# Patient Record
Sex: Male | Born: 1990 | Race: White | Hispanic: No | Marital: Married | Smoking: Former smoker
Health system: Southern US, Community
[De-identification: ages and names within clinical notes are randomized; demographics above are authoritative.]

## PROBLEM LIST (undated history)

## (undated) DIAGNOSIS — S62309A Unspecified fracture of unspecified metacarpal bone, initial encounter for closed fracture: Secondary | ICD-10-CM

## (undated) DIAGNOSIS — J45990 Exercise induced bronchospasm: Secondary | ICD-10-CM

## (undated) DIAGNOSIS — S60511A Abrasion of right hand, initial encounter: Secondary | ICD-10-CM

## (undated) DIAGNOSIS — T7840XA Allergy, unspecified, initial encounter: Secondary | ICD-10-CM

## (undated) HISTORY — DX: Allergy, unspecified, initial encounter: T78.40XA

---
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
## 2008-02-16 ENCOUNTER — Emergency Department (HOSPITAL_COMMUNITY): Admission: EM | Admit: 2008-02-16 | Discharge: 2008-02-16 | Payer: Self-pay | Admitting: Emergency Medicine

## 2010-05-21 ENCOUNTER — Ambulatory Visit: Payer: Self-pay | Admitting: Diagnostic Radiology

## 2010-05-21 ENCOUNTER — Emergency Department (HOSPITAL_BASED_OUTPATIENT_CLINIC_OR_DEPARTMENT_OTHER): Admission: EM | Admit: 2010-05-21 | Discharge: 2010-05-21 | Payer: Self-pay | Admitting: Emergency Medicine

## 2010-11-04 ENCOUNTER — Emergency Department (HOSPITAL_BASED_OUTPATIENT_CLINIC_OR_DEPARTMENT_OTHER)
Admission: EM | Admit: 2010-11-04 | Discharge: 2010-11-04 | Payer: Self-pay | Source: Home / Self Care | Admitting: Emergency Medicine

## 2011-08-16 ENCOUNTER — Emergency Department (INDEPENDENT_AMBULATORY_CARE_PROVIDER_SITE_OTHER): Payer: Managed Care, Other (non HMO)

## 2011-08-16 ENCOUNTER — Encounter: Payer: Self-pay | Admitting: *Deleted

## 2011-08-16 ENCOUNTER — Emergency Department (HOSPITAL_BASED_OUTPATIENT_CLINIC_OR_DEPARTMENT_OTHER)
Admission: EM | Admit: 2011-08-16 | Discharge: 2011-08-16 | Disposition: A | Discharge status: Home / Self Care | Payer: Managed Care, Other (non HMO) | Attending: Emergency Medicine | Admitting: Emergency Medicine

## 2011-08-16 DIAGNOSIS — M25529 Pain in unspecified elbow: Secondary | ICD-10-CM

## 2011-08-16 DIAGNOSIS — S51809A Unspecified open wound of unspecified forearm, initial encounter: Secondary | ICD-10-CM | POA: Insufficient documentation

## 2011-08-16 DIAGNOSIS — Y92410 Unspecified street and highway as the place of occurrence of the external cause: Secondary | ICD-10-CM | POA: Insufficient documentation

## 2011-08-16 DIAGNOSIS — Z189 Retained foreign body fragments, unspecified material: Secondary | ICD-10-CM

## 2011-08-16 DIAGNOSIS — S51812A Laceration without foreign body of left forearm, initial encounter: Secondary | ICD-10-CM

## 2011-08-16 MED ORDER — CEPHALEXIN 500 MG PO CAPS: 500.0000 mg | ORAL_CAPSULE | Freq: Four times a day (QID) | ORAL | Status: AC

## 2011-08-16 MED ORDER — LIDOCAINE HCL 2 % IJ SOLN
INTRAMUSCULAR | Status: AC
  Administered 2011-08-16: 20 mg
  Filled 2011-08-16: qty 1

## 2011-08-16 MED ORDER — HYDROCODONE-ACETAMINOPHEN 5-325 MG PO TABS: 2.0000 | ORAL_TABLET | ORAL | Status: AC | PRN
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
## 2011-08-17 NOTE — ED Provider Notes (Signed)
Medical screening examination/treatment/procedure(s) were performed by non-physician practitioner and as supervising physician I was immediately available for consultation/collaboration.   Giavonni Cizek, MD 08/17/11 0048 

## 2014-01-26 ENCOUNTER — Encounter (HOSPITAL_BASED_OUTPATIENT_CLINIC_OR_DEPARTMENT_OTHER): Payer: Self-pay | Admitting: *Deleted

## 2014-01-26 NOTE — Progress Notes (Signed)
NPO AFTER MN WITH EXCEPTION CLEAR LIQUIDS UNTIL 0800 (NO CREAM/ MILK PRODUCTS).  ARRIVE AT 1245. NEEDS HG.  MAY TAKE HYDROCODONE OR TYLENOL AM DOS W/ SIPS  OF WATER.

## 2014-01-27 ENCOUNTER — Encounter (HOSPITAL_BASED_OUTPATIENT_CLINIC_OR_DEPARTMENT_OTHER): Payer: BC Managed Care – PPO | Admitting: Anesthesiology

## 2014-01-27 ENCOUNTER — Encounter (HOSPITAL_BASED_OUTPATIENT_CLINIC_OR_DEPARTMENT_OTHER): Payer: Self-pay

## 2014-01-27 ENCOUNTER — Encounter (HOSPITAL_BASED_OUTPATIENT_CLINIC_OR_DEPARTMENT_OTHER)
Admission: RE | Disposition: A | Discharge status: Home / Self Care | Payer: Self-pay | Source: Ambulatory Visit | Attending: Orthopedic Surgery

## 2014-01-27 ENCOUNTER — Ambulatory Visit (HOSPITAL_BASED_OUTPATIENT_CLINIC_OR_DEPARTMENT_OTHER): Payer: BC Managed Care – PPO | Admitting: Anesthesiology

## 2014-01-27 ENCOUNTER — Ambulatory Visit (HOSPITAL_BASED_OUTPATIENT_CLINIC_OR_DEPARTMENT_OTHER)
Admission: RE | Admit: 2014-01-27 | Discharge: 2014-01-27 | Disposition: A | Discharge status: Home / Self Care | Payer: BC Managed Care – PPO | Source: Ambulatory Visit | Attending: Orthopedic Surgery | Admitting: Orthopedic Surgery

## 2014-01-27 DIAGNOSIS — F17200 Nicotine dependence, unspecified, uncomplicated: Secondary | ICD-10-CM | POA: Insufficient documentation

## 2014-01-27 DIAGNOSIS — Z79899 Other long term (current) drug therapy: Secondary | ICD-10-CM | POA: Insufficient documentation

## 2014-01-27 DIAGNOSIS — J45990 Exercise induced bronchospasm: Secondary | ICD-10-CM | POA: Insufficient documentation

## 2014-01-27 DIAGNOSIS — X58XXXA Exposure to other specified factors, initial encounter: Secondary | ICD-10-CM | POA: Insufficient documentation

## 2014-01-27 DIAGNOSIS — S62319A Displaced fracture of base of unspecified metacarpal bone, initial encounter for closed fracture: Secondary | ICD-10-CM

## 2014-01-27 HISTORY — DX: Exercise induced bronchospasm: J45.990

## 2014-01-27 HISTORY — DX: Abrasion of right hand, initial encounter: S60.511A

## 2014-01-27 HISTORY — PX: FINGER CLOSED REDUCTION: SHX1633

## 2014-01-27 HISTORY — DX: Unspecified fracture of unspecified metacarpal bone, initial encounter for closed fracture: S62.309A

## 2014-01-27 LAB — POCT HEMOGLOBIN-HEMACUE: HEMOGLOBIN: 15.2 g/dL (ref 13.0–17.0)

## 2014-01-27 SURGERY — CLOSED REDUCTION, FRACTURE, METACARPAL BONE
Anesthesia: General | Site: Finger | Laterality: Right

## 2014-01-27 MED ORDER — PROMETHAZINE HCL 25 MG/ML IJ SOLN
6.2500 mg | INTRAMUSCULAR | Status: DC | PRN
  Filled 2014-01-27: qty 1

## 2014-01-27 MED ORDER — HYDROMORPHONE HCL PF 1 MG/ML IJ SOLN
INTRAMUSCULAR | Status: AC
  Filled 2014-01-27: qty 1

## 2014-01-27 MED ORDER — FENTANYL CITRATE 0.05 MG/ML IJ SOLN
INTRAMUSCULAR | Status: AC
  Filled 2014-01-27: qty 6

## 2014-01-27 MED ORDER — HYDROMORPHONE HCL PF 1 MG/ML IJ SOLN
0.2500 mg | INTRAMUSCULAR | Status: DC | PRN
  Administered 2014-01-27: 0.5 mg via INTRAVENOUS
  Filled 2014-01-27: qty 1

## 2014-01-27 MED ORDER — OXYCODONE HCL 5 MG PO TABS
5.0000 mg | ORAL_TABLET | Freq: Once | ORAL | Status: AC | PRN
  Administered 2014-01-27: 5 mg via ORAL
  Filled 2014-01-27: qty 1

## 2014-01-27 MED ORDER — LIDOCAINE HCL (CARDIAC) 20 MG/ML IV SOLN
INTRAVENOUS | Status: DC | PRN
Start: 2014-01-27 — End: 2014-01-27
  Administered 2014-01-27: 60 mg via INTRAVENOUS

## 2014-01-27 MED ORDER — MIDAZOLAM HCL 2 MG/2ML IJ SOLN
INTRAMUSCULAR | Status: AC
  Filled 2014-01-27: qty 2

## 2014-01-27 MED ORDER — CEFAZOLIN SODIUM-DEXTROSE 2-3 GM-% IV SOLR
2.0000 g | INTRAVENOUS | Status: AC
  Administered 2014-01-27: 2 g via INTRAVENOUS
  Filled 2014-01-27: qty 50

## 2014-01-27 MED ORDER — MIDAZOLAM HCL 5 MG/5ML IJ SOLN
INTRAMUSCULAR | Status: DC | PRN
  Administered 2014-01-27: 2 mg via INTRAVENOUS

## 2014-01-27 MED ORDER — ONDANSETRON HCL 4 MG/2ML IJ SOLN
INTRAMUSCULAR | Status: AC
  Filled 2014-01-27: qty 2

## 2014-01-27 MED ORDER — OXYCODONE HCL 5 MG PO TABS
ORAL_TABLET | ORAL | Status: AC
  Filled 2014-01-27: qty 1

## 2014-01-27 MED ORDER — OXYCODONE HCL 5 MG/5ML PO SOLN
5.0000 mg | Freq: Once | ORAL | Status: AC | PRN
  Filled 2014-01-27: qty 5

## 2014-01-27 MED ORDER — MEPERIDINE HCL 25 MG/ML IJ SOLN
6.2500 mg | INTRAMUSCULAR | Status: DC | PRN
  Filled 2014-01-27: qty 1

## 2014-01-27 MED ORDER — HYDROCODONE-ACETAMINOPHEN 5-325 MG PO TABS: 1.0000 | ORAL_TABLET | Freq: Four times a day (QID) | ORAL | Status: DC | PRN

## 2014-01-27 MED ORDER — CHLORHEXIDINE GLUCONATE 4 % EX LIQD
60.0000 mL | Freq: Once | CUTANEOUS | Status: DC
  Filled 2014-01-27: qty 60

## 2014-01-27 MED ORDER — BUPIVACAINE HCL (PF) 0.5 % IJ SOLN
INTRAMUSCULAR | Status: DC | PRN
Start: 2014-01-27 — End: 2014-01-27
  Administered 2014-01-27: 4 mL

## 2014-01-27 MED ORDER — LACTATED RINGERS IV SOLN
INTRAVENOUS | Status: DC
  Administered 2014-01-27 (×2): via INTRAVENOUS
  Filled 2014-01-27: qty 1000

## 2014-01-27 MED ORDER — LIDOCAINE HCL 1 % IJ SOLN
INTRAMUSCULAR | Status: DC | PRN
  Administered 2014-01-27: 4 mL

## 2014-01-27 MED ORDER — PROPOFOL 10 MG/ML IV BOLUS
INTRAVENOUS | Status: DC | PRN
  Administered 2014-01-27: 200 mg via INTRAVENOUS

## 2014-01-27 MED ORDER — FENTANYL CITRATE 0.05 MG/ML IJ SOLN
INTRAMUSCULAR | Status: DC | PRN
  Administered 2014-01-27 (×2): 25 ug via INTRAVENOUS
  Administered 2014-01-27: 50 ug via INTRAVENOUS

## 2014-01-27 MED ORDER — DEXAMETHASONE SODIUM PHOSPHATE 4 MG/ML IJ SOLN
INTRAMUSCULAR | Status: DC | PRN
  Administered 2014-01-27: 10 mg via INTRAVENOUS

## 2014-01-27 MED ORDER — ONDANSETRON HCL 4 MG/2ML IJ SOLN
INTRAMUSCULAR | Status: DC | PRN
  Administered 2014-01-27: 4 mg via INTRAVENOUS

## 2014-01-27 MED ORDER — DOCUSATE SODIUM 100 MG PO CAPS: 100.0000 mg | ORAL_CAPSULE | Freq: Two times a day (BID) | ORAL | Status: DC

## 2014-01-27 SURGICAL SUPPLY — 55 items
BANDAGE ELASTIC 3 VELCRO ST LF (GAUZE/BANDAGES/DRESSINGS) ×6 IMPLANT
BANDAGE GAUZE ELAST BULKY 4 IN (GAUZE/BANDAGES/DRESSINGS) ×3 IMPLANT
BENZOIN TINCTURE PRP APPL 2/3 (GAUZE/BANDAGES/DRESSINGS) IMPLANT
BLADE SURG 15 STRL LF DISP TIS (BLADE) ×1 IMPLANT
BLADE SURG 15 STRL SS (BLADE) ×2
BNDG COHESIVE 3X5 TAN STRL LF (GAUZE/BANDAGES/DRESSINGS) ×6 IMPLANT
BNDG GAUZE ELAST 4 BULKY (GAUZE/BANDAGES/DRESSINGS) ×3 IMPLANT
CLOSURE WOUND 1/2 X4 (GAUZE/BANDAGES/DRESSINGS)
CLOTH BEACON ORANGE TIMEOUT ST (SAFETY) ×3 IMPLANT
CORDS BIPOLAR (ELECTRODE) IMPLANT
COVER MAYO STAND STRL (DRAPES) ×3 IMPLANT
COVER TABLE BACK 60X90 (DRAPES) ×3 IMPLANT
CUFF TOURNIQUET SINGLE 18IN (TOURNIQUET CUFF) ×3 IMPLANT
CUFF TOURNIQUET SINGLE 24IN (TOURNIQUET CUFF) IMPLANT
DRAIN PENROSE 18X1/4 LTX STRL (WOUND CARE) IMPLANT
DRAPE EXTREMITY T 121X128X90 (DRAPE) ×3 IMPLANT
DRAPE OEC MINIVIEW 54X84 (DRAPES) ×3 IMPLANT
DRAPE SURG 17X23 STRL (DRAPES) ×3 IMPLANT
DRSG EMULSION OIL 3X3 NADH (GAUZE/BANDAGES/DRESSINGS) IMPLANT
ELECT NEEDLE TIP 2.8 STRL (NEEDLE) IMPLANT
ELECT REM PT RETURN 9FT ADLT (ELECTROSURGICAL)
ELECTRODE REM PT RTRN 9FT ADLT (ELECTROSURGICAL) IMPLANT
GAUZE SPONGE 4X4 16PLY XRAY LF (GAUZE/BANDAGES/DRESSINGS) IMPLANT
GAUZE XEROFORM 1X8 LF (GAUZE/BANDAGES/DRESSINGS) ×3 IMPLANT
GLOVE BIO SURGEON STRL SZ8 (GLOVE) ×3 IMPLANT
GLOVE BIOGEL PI IND STRL 7.5 (GLOVE) ×2 IMPLANT
GLOVE BIOGEL PI INDICATOR 7.5 (GLOVE) ×4
GLOVE SURG SS PI 7.5 STRL IVOR (GLOVE) ×3 IMPLANT
GOWN STRL REUS W/TWL LRG LVL3 (GOWN DISPOSABLE) IMPLANT
GOWN STRL REUS W/TWL XL LVL3 (GOWN DISPOSABLE) ×6 IMPLANT
K-WIRE .035X4 (WIRE) IMPLANT
K-WIRE .045X4 (WIRE) ×15 IMPLANT
LOOP VESSEL MAXI BLUE (MISCELLANEOUS) IMPLANT
NEEDLE HYPO 25X1 1.5 SAFETY (NEEDLE) IMPLANT
NS IRRIG 500ML POUR BTL (IV SOLUTION) ×3 IMPLANT
PACK BASIN DAY SURGERY FS (CUSTOM PROCEDURE TRAY) ×3 IMPLANT
PAD CAST 3X4 CTTN HI CHSV (CAST SUPPLIES) ×1 IMPLANT
PADDING CAST ABS 4INX4YD NS (CAST SUPPLIES) ×2
PADDING CAST ABS COTTON 4X4 ST (CAST SUPPLIES) ×1 IMPLANT
PADDING CAST COTTON 3X4 STRL (CAST SUPPLIES) ×2
PENCIL BUTTON HOLSTER BLD 10FT (ELECTRODE) IMPLANT
SPLINT PLASTER CAST XFAST 3X15 (CAST SUPPLIES) ×1 IMPLANT
SPLINT PLASTER XTRA FASTSET 3X (CAST SUPPLIES) ×2
SPONGE GAUZE 4X4 12PLY (GAUZE/BANDAGES/DRESSINGS) ×3 IMPLANT
STOCKINETTE 4X48 STRL (DRAPES) ×3 IMPLANT
STRIP CLOSURE SKIN 1/2X4 (GAUZE/BANDAGES/DRESSINGS) IMPLANT
SUT ETHILON 4 0 P 3 18 (SUTURE) IMPLANT
SUT ETHILON 5 0 P 3 18 (SUTURE)
SUT NYLON ETHILON 5-0 P-3 1X18 (SUTURE) IMPLANT
SYR BULB 3OZ (MISCELLANEOUS) ×3 IMPLANT
SYR CONTROL 10ML LL (SYRINGE) ×3 IMPLANT
TOWEL OR 17X24 6PK STRL BLUE (TOWEL DISPOSABLE) ×6 IMPLANT
UNDERPAD 30X30 INCONTINENT (UNDERPADS AND DIAPERS) ×3 IMPLANT
WATER STERILE IRR 500ML POUR (IV SOLUTION) ×3 IMPLANT
k-wires ×6 IMPLANT

## 2014-01-27 NOTE — Discharge Instructions (Signed)
KEEP BANDAGE CLEAN AND DRY CALL OFFICE FOR F/U APPT [phone] in 10 days [facility] PH[facility] [phone] KEEP HAND ELEVATED ABOVE HEART OK TO APPLY ICE TO OPERATIVE AREA CONTACT OFFICE IF ANY WORSENING PAIN OR CONCERNS.        HAND SURGERY    HOME CARE INSTRUCTIONS    The following instructions have been prepared to help you care for yourself upon your return home today.  Wound Care:  Keep your hand elevated above the level of your heart. Do not allow it to dangle by your side. Keep the dressing dry and do not remove it unless your doctor advises you to do so. He will usually change it at the time of you post-op visit. Moving your fingers is advised to stimulate circulation but will depend on the site of your surgery. Of course, if you have a splint applied your doctor will advise you about movement.  Activity:  Do not drive or operate machinery today. Rest today and then you may return to your normal activity and work as indicated by your physician.  Diet: Drink liquids today or eat a light diet. You may resume a regular diet tomorrow.  General expectations: Pain for two or three days. Fingers may become slightly swollen.   Unexpected Observations- Call your doctor if any of these occur: Severe pain not relieved by pain medication. Elevated temperature. Dressing soaked with blood. Inability to move fingers. White or bluish color to fingers.      Post Anesthesia Home Care Instructions  Activity: Get plenty of rest for the remainder of the day. A responsible adult should stay with you for 24 hours following the procedure.  For the next 24 hours, DO NOT: -Drive a car -Advertising copywriterperate machinery -Drink alcoholic beverages -Take any medication unless instructed by your physician -Make any legal decisions or sign important papers.  Meals: Start with liquid foods such as gelatin or soup. Progress to regular foods as tolerated. Avoid greasy, spicy, heavy foods. If nausea and/or  vomiting occur, drink only clear liquids until the nausea and/or vomiting subsides. Call your physician if vomiting continues.  Special Instructions/Symptoms: Your throat may feel dry or sore from the anesthesia or the breathing tube placed in your throat during surgery. If this causes discomfort, gargle with warm salt water. The discomfort should disappear within 24 hours.

## 2014-01-27 NOTE — H&P (Signed)
Chad Strickland is an 23 y.o. male.   Chief Complaint: right hand injury HPI: pt seen/evaluated in office  Pt with displaced right small finger metacarpal fracture Pt here for surgery for displaced fracture No prior surgery to right hand  Past Medical History  Diagnosis Date  . Asthma, exercise induced   . Metacarpal bone fracture     right small finger  . Abrasion of hand, right     History reviewed. No pertinent past surgical history.  History reviewed. No pertinent family history. Social History:  reports that he has been smoking Cigarettes.  He has a 1.75 pack-year smoking history. He has never used smokeless tobacco. He reports that he drinks alcohol. He reports that he does not use illicit drugs.  Allergies: No Known Allergies  Medications Prior to Admission  Medication Sig Dispense Refill  . cetirizine (ZYRTEC) 10 MG tablet Take 10 mg by mouth daily.      Marland Kitchen. acetaminophen (TYLENOL) 500 MG tablet Take 500 mg by mouth every 6 (six) hours as needed.      . ALBUTEROL IN Inhale into the lungs as needed.      Marland Kitchen. HYDROcodone-acetaminophen (NORCO/VICODIN) 5-325 MG per tablet Take 1 tablet by mouth every 6 (six) hours as needed for moderate pain.      Marland Kitchen. ibuprofen (ADVIL,MOTRIN) 200 MG tablet Take 200 mg by mouth every 6 (six) hours as needed.        No results found for this or any previous visit (from the past 48 hour(s)). No results found.  ROS NO RECENT ILLNESSES OR HOSPITALIZATIONS  Blood pressure 138/73, pulse 66, temperature 96.8 F (36 C), temperature source Oral, resp. rate 14, height 5\' 8"  (1.727 m), weight 56.473 kg (124 lb 8 oz), SpO2 100.00%. Physical Exam  General Appearance:  Alert, cooperative, no distress, appears stated age  Head:  Normocephalic, without obvious abnormality, atraumatic  Eyes:  Pupils equal, conjunctiva/corneas clear,         Throat: Lips, mucosa, and tongue normal; teeth and gums normal  Neck: No visible masses     Lungs:   respirations  unlabored  Chest Wall:  No tenderness or deformity  Heart:  Regular rate and rhythm,  Abdomen:   Soft, non-tender,         Extremities: RIGHT HAND: SPLINT IN PLACE FINGERS WARM WELL PERFUSED GOOD CAP REFIL  Pulses: 2+ and symmetric  Skin: Skin color, texture, turgor normal, no rashes or lesions     Neurologic: Normal    Assessment/Plan RIGHT SMALL FINGER METACARPAL BASE FRACTURE, DISPLACED  RIGHT SMALL FINGER CLOSED REDUCTION AND PINNING POSSIBLE OPEN REDUCTION AND INTERNAL FIXATION  R/B/A DISCUSSED WITH PT IN OFFICE.  PT VOICED UNDERSTANDING OF PLAN CONSENT SIGNED DAY OF SURGERY PT SEEN AND EXAMINED PRIOR TO OPERATIVE PROCEDURE/DAY OF SURGERY SITE MARKED. QUESTIONS ANSWERED WILL [facility] SURGERY  Thomasene RippleFred W [facility] 01/27/2014, 1:04 PM

## 2014-01-27 NOTE — Progress Notes (Signed)
Complained of nausea with IV start, become unresponsive. Head lowered the slowly came around and starting responding to verbal commands. Dr. Renold DonGermeroth into see.

## 2014-01-27 NOTE — Anesthesia Procedure Notes (Signed)
Procedure Name: LMA Insertion Date/Time: 01/27/2014 2:12 PM Performed by: Renella CunasHAZEL, Rameses Ou D Pre-anesthesia Checklist: Patient identified, Emergency Drugs available, Suction available and Patient being monitored Patient Re-evaluated:Patient Re-evaluated prior to inductionOxygen Delivery Method: Circle System Utilized Preoxygenation: Pre-oxygenation with 100% oxygen Intubation Type: IV induction Ventilation: Mask ventilation without difficulty LMA: LMA inserted LMA Size: 4.0 Number of attempts: 1 Airway Equipment and Method: bite block Placement Confirmation: positive ETCO2 Tube secured with: Tape Dental Injury: Teeth and Oropharynx as per pre-operative assessment

## 2014-01-27 NOTE — Brief Op Note (Signed)
01/27/2014  1:06 PM  PATIENT:  Chad Strickland  23 y.o. male  PRE-OPERATIVE DIAGNOSIS:  RIGHT SMALL FINGER METACARPAL FRACTURE  POST-OPERATIVE DIAGNOSIS:  * No post-op diagnosis entered *  PROCEDURE:  Procedure(s): CLOSED REDUCTION AND PINNING AND POSSIBLE ORIF (Right)  SURGEON:  Surgeon(s) and Role:    * Sharma CovertFred W Maxamilian Amadon, MD - Primary  PHYSICIAN ASSISTANT:   ASSISTANTS: none   ANESTHESIA:   general  EBL:     BLOOD ADMINISTERED:none  DRAINS: none   LOCAL MEDICATIONS USED:  MARCAINE     SPECIMEN:  No Specimen  DISPOSITION OF SPECIMEN:  N/A  COUNTS:  YES  TOURNIQUET:  * No tourniquets in log *  DICTATIO: [id number]: [id number]  PLAN OF CARE: Discharge to home after PACU  PATIENT DISPOSITION:  PACU - hemodynamically stable.   Delay start of Pharmacological VTE agent (>24hrs) due to surgical blood loss or risk of bleeding: not applicable

## 2014-01-27 NOTE — Anesthesia Postprocedure Evaluation (Signed)
  Anesthesia Post-op Note  Patient: Chad Strickland  Procedure(s) Performed: Procedure(s) (LRB): CLOSED REDUCTION  of small finger metacarpal fracture PINNING with x 3 k-wires (Right)  Patient Location: PACU  Anesthesia Type: General  Level of Consciousness: awake and alert   Airway and Oxygen Therapy: Patient Spontanous Breathing  Post-op Pain: mild  Post-op Assessment: Post-op Vital signs reviewed, Patient's Cardiovascular Status Stable, Respiratory Function Stable, Patent Airway and No signs of Nausea or vomiting  Last Vitals:  Filed Vitals:   01/27/14 1634  BP: 118/81  Pulse: 72  Temp: 36.3 C  Resp: 16    Post-op Vital Signs: stable   Complications: No apparent anesthesia complications

## 2014-01-27 NOTE — Transfer of Care (Signed)
Immediate Anesthesia Transfer of Care Note  Patient: Chad Strickland  Procedure(s) Performed: Procedure(s) (LRB): CLOSED REDUCTION AND PINNING with x 3 k-wires (Right)  Patient Location: PACU  Anesthesia Type: General  Level of Consciousness: awake, oriented, sedated and patient cooperative  Airway & Oxygen Therapy: Patient Spontanous Breathing and Patient connected to face mask oxygen  Post-op Assessment: Report given to PACU RN and Post -op Vital signs reviewed and stable  Post vital signs: Reviewed and stable  Complications: No apparent anesthesia complications

## 2014-01-27 NOTE — Anesthesia Preprocedure Evaluation (Addendum)
Anesthesia Evaluation  Patient identified by MRN, date of birth, ID band Patient awake    Reviewed: Allergy & Precautions, H&P , NPO status , Patient's Chart, lab work & pertinent test results  Airway Mallampati: I TM Distance: >3 FB Neck ROM: Full    Dental   Pulmonary asthma , Current Smoker,  breath sounds clear to auscultation        Cardiovascular negative cardio ROS  Rhythm:Regular Rate:Normal     Neuro/Psych negative neurological ROS  negative psych ROS   GI/Hepatic negative GI ROS, Neg liver ROS,   Endo/Other  negative endocrine ROS  Renal/GU negative Renal ROS     Musculoskeletal negative musculoskeletal ROS (+)   Abdominal   Peds  Hematology negative hematology ROS (+)   Anesthesia Other Findings   Reproductive/Obstetrics                         Anesthesia Physical Anesthesia Plan  ASA: II  Anesthesia Plan: General   Post-op Pain Management:    Induction: Intravenous  Airway Management Planned: LMA  Additional Equipment:   Intra-op Plan:   Post-operative Plan: Extubation in OR  Informed Consent: I have reviewed the patients History and Physical, chart, labs and discussed the procedure including the risks, benefits and alternatives for the proposed anesthesia with the patient or authorized representative who has indicated his/her understanding and acceptance.   Dental advisory given  Plan Discussed with: CRNA  Anesthesia Plan Comments:        Anesthesia Quick Evaluation

## 2014-01-28 ENCOUNTER — Encounter (HOSPITAL_BASED_OUTPATIENT_CLINIC_OR_DEPARTMENT_OTHER): Payer: Self-pay | Admitting: Orthopedic Surgery
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
## 2015-02-16 ENCOUNTER — Other Ambulatory Visit: Payer: Self-pay | Admitting: Family Medicine

## 2015-02-16 ENCOUNTER — Ambulatory Visit
Admission: RE | Admit: 2015-02-16 | Discharge: 2015-02-16 | Disposition: A | Discharge status: Home / Self Care | Payer: BLUE CROSS/BLUE SHIELD | Source: Ambulatory Visit | Attending: Family Medicine | Admitting: Family Medicine

## 2015-02-16 DIAGNOSIS — R42 Dizziness and giddiness: Secondary | ICD-10-CM

## 2015-02-16 DIAGNOSIS — R112 Nausea with vomiting, unspecified: Secondary | ICD-10-CM

## 2015-02-16 DIAGNOSIS — R519 Headache, unspecified: Secondary | ICD-10-CM

## 2015-02-16 DIAGNOSIS — R51 Headache: Secondary | ICD-10-CM

## 2016-02-11 IMAGING — CT CT HEAD W/O CM
1 series · 16 of 30 positions shown, 20 images · non-contrast
Comparison: None.

CLINICAL DATA: Headaches, dizziness, nausea, and vomiting. Symptoms
began yesterday.

EXAM:
CT HEAD WITHOUT CONTRAST
TECHNIQUE: Contiguous axial images were obtained from the base of the skull
through the vertex without intravenous contrast.

[Series 3: head wo 5.0 h31s · axial · 0.47mm/px · z∈[+222,+357]mm · 16 of 31 slices shown, 20 images]
[im 2/31  brain]
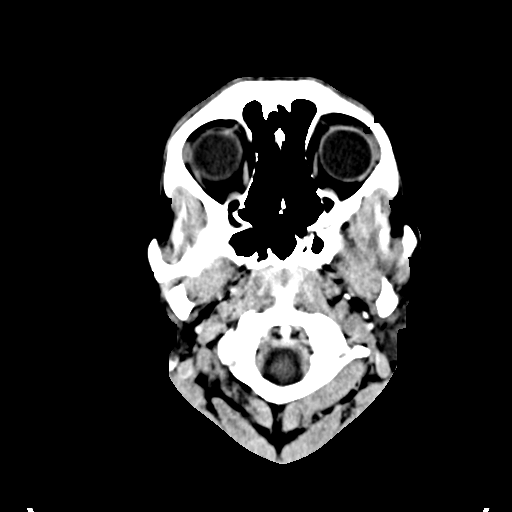
[im 2/31  bone]
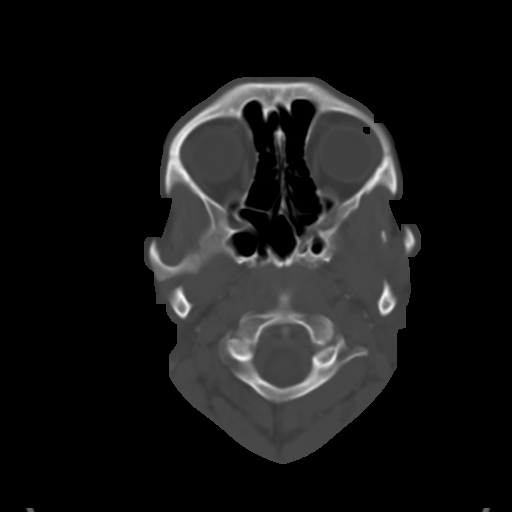
[im 4/31  brain]
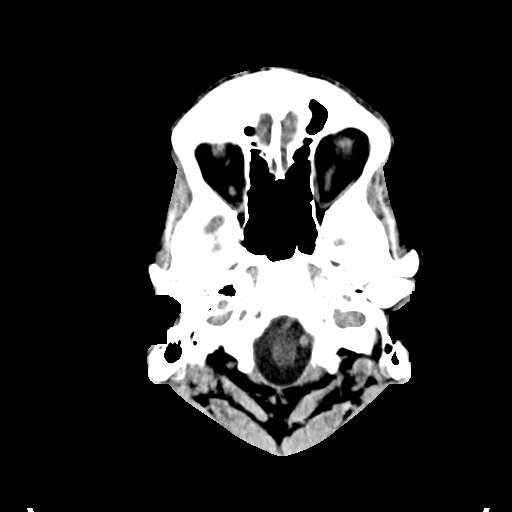
[im 6/31  brain]
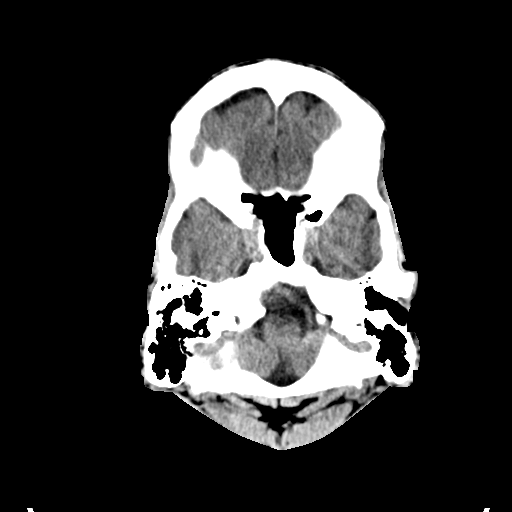
[im 8/31  brain]
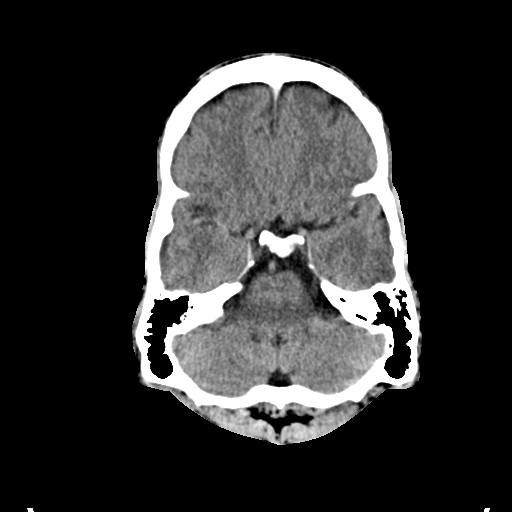
[im 9/31  brain]
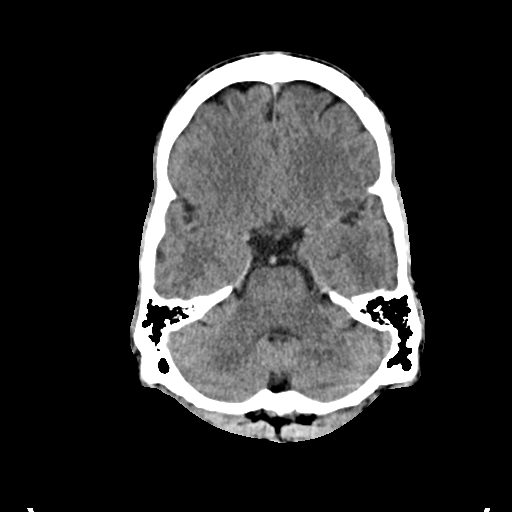
[im 9/31  bone]
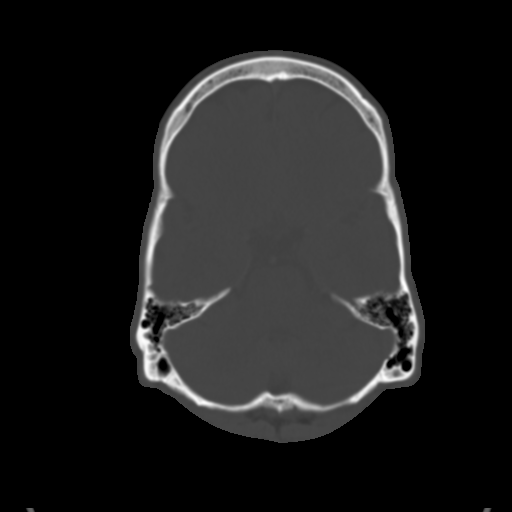
[im 11/31  brain]
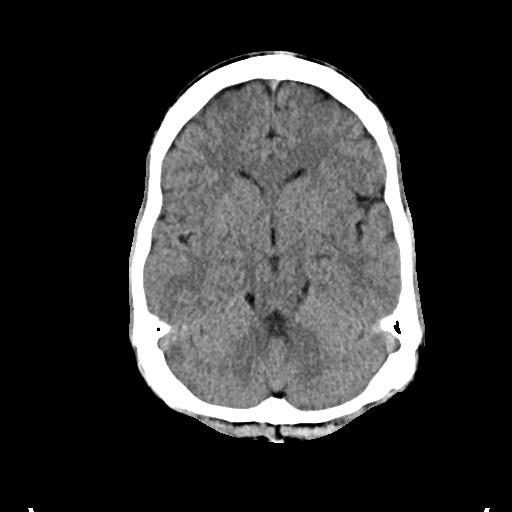
[im 13/31  brain]
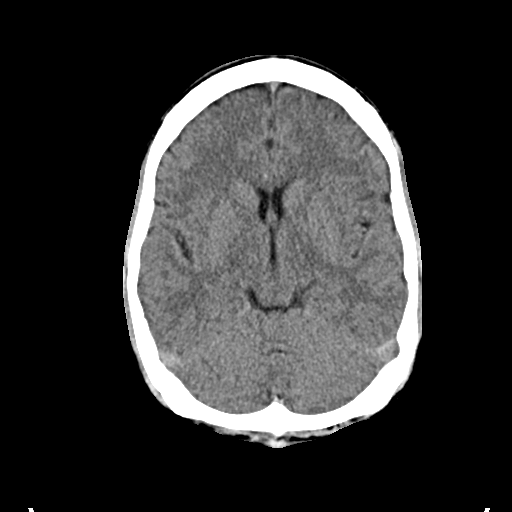
[im 15/31  brain]
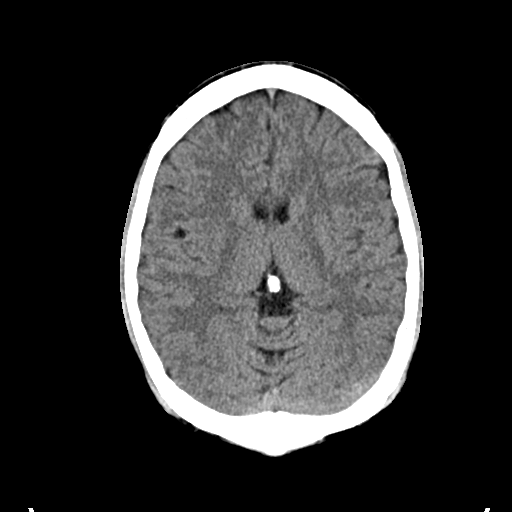
[im 16/31  brain]
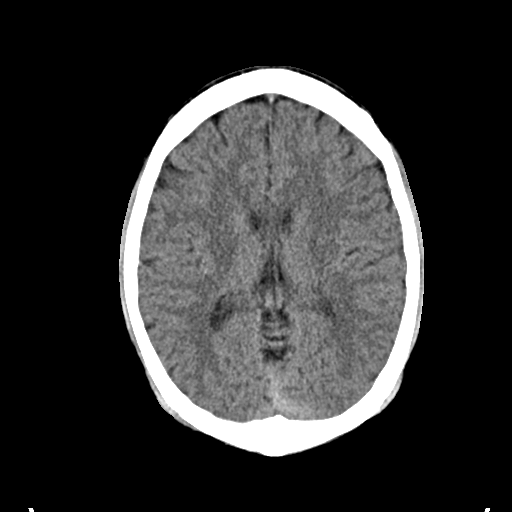
[im 16/31  bone]
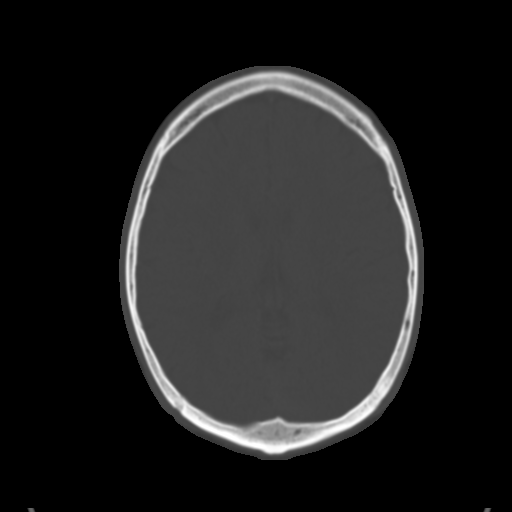
[im 18/31  brain]
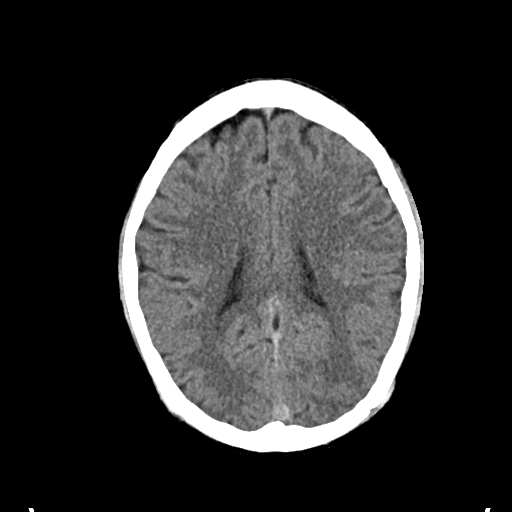
[im 20/31  brain]
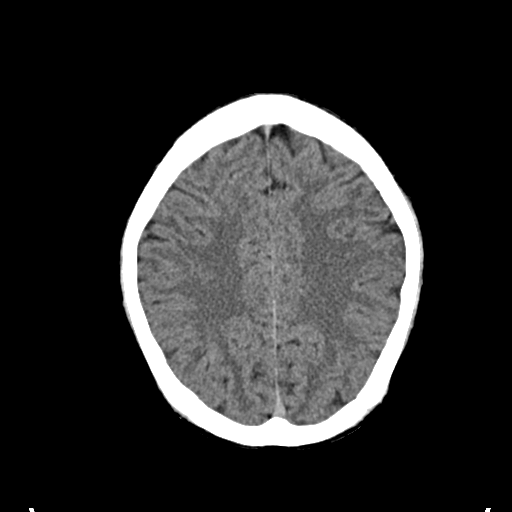
[im 22/31  brain]
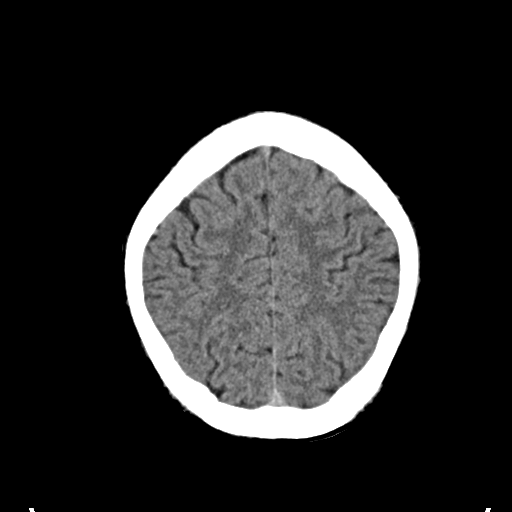
[im 23/31  brain]
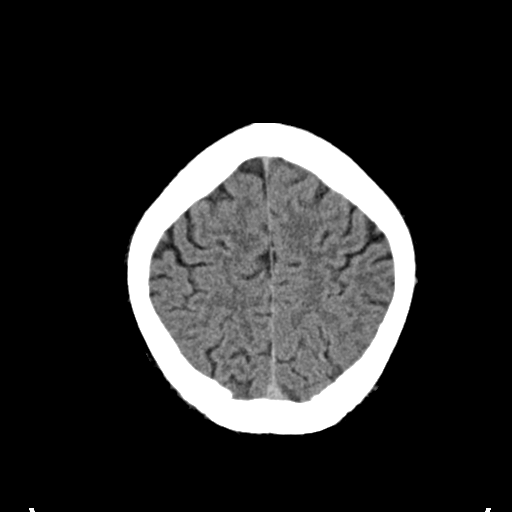
[im 23/31  bone]
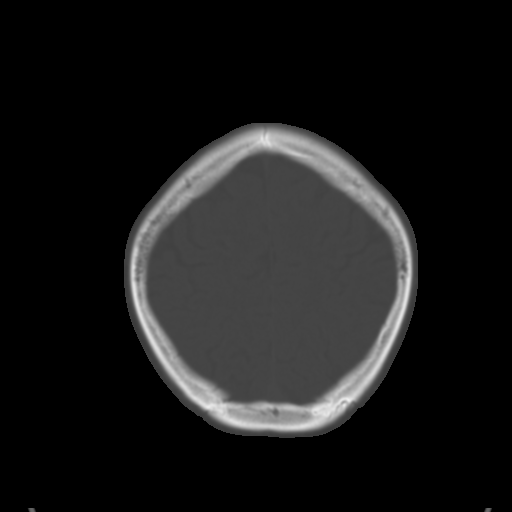
[im 25/31  brain]
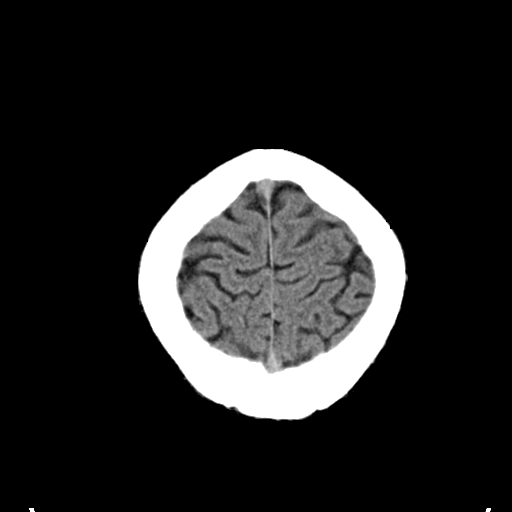
[im 27/31  brain]
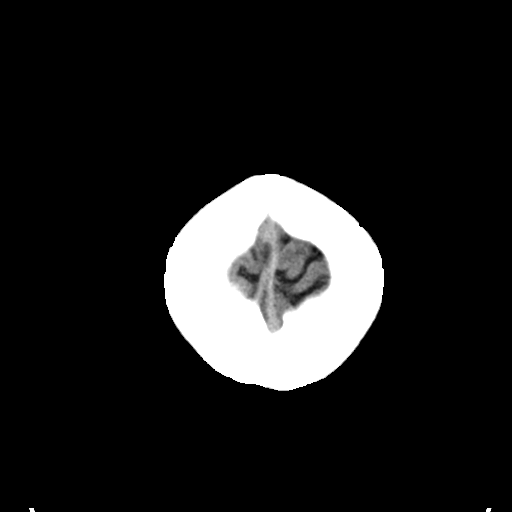
[im 29/31  brain]
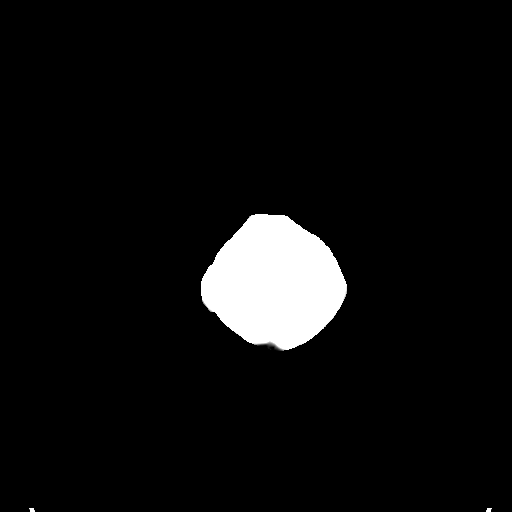

[16 of 30 positions shown; findings below may reference images not displayed]

FINDINGS: The ventricles and sulci are within normal limits for age. There is
no evidence of acute infarct, intracranial hemorrhage, mass, midline
shift, or extra-axial collection.

The visualized orbits are unremarkable. The visualized paranasal
sinuses and mastoid air cells are clear. No skull fracture is
identified.
IMPRESSION: Unremarkable head CT.

These results will be called to the ordering clinician or
representative by the [HOSPITAL] at the imaging location.

## 2018-11-25 ENCOUNTER — Encounter: Payer: Self-pay | Admitting: Medical

## 2018-11-25 ENCOUNTER — Ambulatory Visit (INDEPENDENT_AMBULATORY_CARE_PROVIDER_SITE_OTHER): Payer: Managed Care, Other (non HMO) | Admitting: Medical

## 2018-11-25 VITALS — BP 125/80 | HR 58 | Temp 98.3°F | Resp 16 | Ht 68.0 in | Wt 164.3 lb

## 2018-11-25 DIAGNOSIS — G43809 Other migraine, not intractable, without status migrainosus: Secondary | ICD-10-CM

## 2018-11-25 DIAGNOSIS — J302 Other seasonal allergic rhinitis: Secondary | ICD-10-CM | POA: Diagnosis not present

## 2018-11-25 DIAGNOSIS — J4520 Mild intermittent asthma, uncomplicated: Secondary | ICD-10-CM | POA: Diagnosis not present

## 2018-11-25 MED ORDER — ALBUTEROL SULFATE HFA 108 (90 BASE) MCG/ACT IN AERS: 2.0000 | INHALATION_SPRAY | Freq: Four times a day (QID) | RESPIRATORY_TRACT | 2 refills | Status: DC | PRN

## 2018-11-25 MED ORDER — ALBUTEROL SULFATE HFA 108 (90 BASE) MCG/ACT IN AERS: 2.0000 | INHALATION_SPRAY | Freq: Four times a day (QID) | RESPIRATORY_TRACT | 2 refills | Status: AC | PRN

## 2018-11-25 NOTE — Progress Notes (Signed)
Subjective:    Patient ID: Chad Strickland, male    DOB: 04/04/1991, 28 y.o.   MRN: [id number]  HPI Pt in for first time here.  He states no prior pcp. 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 He went to [employer]. Pt does not exercise regularly. But does play paint ball a lot on weekend when it is not cold. Pt admits he does not eat healthy. He does vape daily. 1-2 beers a week at most.  Pt is married. No children.  He has history allergic rhinitis mostly in spring and summer time. Also likely allergic to his cat.  Hx of rare asthma now. But worse when child. He request inhaler to use if needed  Hx of migraines in past. Maybe one a month. He gets light sensitive and just try to sleep it off. He was seen one time for severe HA. Pt states had ct scan of head and negative 3-4 year ago.    Review of Systems  Constitutional: Negative for chills, fatigue and fever.  HENT: Negative for congestion, drooling, facial swelling, mouth sores, rhinorrhea, sinus pressure, sinus pain and sore throat.   Respiratory: Negative for cough, chest tightness, shortness of breath and wheezing.   Cardiovascular: Negative for chest pain and palpitations.  Gastrointestinal: Negative for abdominal pain.  Musculoskeletal: Negative for back pain.  Skin: Negative for rash.  Neurological: Negative for dizziness, speech difficulty and light-headedness.  Hematological: Negative for adenopathy. Does not bruise/bleed easily.  Psychiatric/Behavioral: Negative for behavioral problems, confusion and suicidal ideas. The patient is not nervous/anxious.     Past Medical History:  Diagnosis Date  . Abrasion of hand, right   . Asthma, exercise induced   . Metacarpal bone fracture    right small finger     Social History   Socioeconomic History  . Marital status: Married    Spouse name: Not on file  . Number of children: Not on file  . Years of education: Not on file  . Highest education level: Not on file    Occupational History  . Not on file  Social Needs  . Financial resource strain: Not on file  . Food insecurity:    Worry: Not on file    Inability: Not on file  . Transportation needs:    Medical: Not on file    Non-medical: Not on file  Tobacco Use  . Smoking status: Current Every Day Smoker    Packs/day: 0.25    Years: 7.00    Pack years: 1.75    Types: Cigarettes  . Smokeless tobacco: Never Used  Substance and Sexual Activity  . Alcohol use: Yes    Comment: occasional  . Drug use: No  . Sexual activity: Not on file  Lifestyle  . Physical activity:    Days per week: Not on file    Minutes per session: Not on file  . Stress: Not on file  Relationships  . Social connections:    Talks on phone: Not on file    Gets together: Not on file    Attends religious service: Not on file    Active member of club or organization: Not on file    Attends meetings of clubs or organizations: Not on file    Relationship status: Not on file  . Intimate partner violence:    Fear of current or ex partner: Not on file    Emotionally abused: Not on file    Physically abused: Not on file  Forced sexual activity: Not on file  Other Topics Concern  . Not on file  Social History Narrative  . Not on file    Past Surgical History:  Procedure Laterality Date  . FINGER CLOSED REDUCTION Right 01/27/2014   Procedure: CLOSED REDUCTION  of small finger metacarpal fracture PINNING with x 3 k-wires;  Surgeon: Sharma Covert, MD;  Location: [facility];  Service: Orthopedics;  Laterality: Right;    No family history on file.  No Known Allergies  Current Outpatient Medications on File Prior to Visit  Medication Sig Dispense Refill  . cetirizine (ZYRTEC) 10 MG tablet Take 10 mg by mouth daily.     No current facility-administered medications on file prior to visit.     BP (!) 135/94   Pulse (!) 58   Temp 98.3 F (36.8 C) (Oral)   Resp 16   Ht 5\' 8"  (1.727 m)   Wt 164 lb  4.8 oz (74.5 kg)   SpO2 100%   BMI 24.98 kg/m       Objective:   Physical Exam  General Mental Status- Alert. General Appearance- Not in acute distress.   Skin General: Color- Normal Color. Moisture- Normal Moisture.  Neck Carotid Arteries- Normal color. Moisture- Normal Moisture. No carotid bruits. No JVD.  Chest and Lung Exam Auscultation: Breath Sounds:-Normal.  Cardiovascular Auscultation:Rythm- Regular. Murmurs & Other Heart Sounds:Auscultation of the heart reveals- No Murmurs.  . Neurologic Cranial Nerve exam:- CN III-XII intact(No nystagmus), symmetric smile. Strength:- 5/5 equal and symmetric strength both upper and lower extremities.      Assessment & Plan:  For your history of allergic rhinitis, will recommend that you continue with Zyrtec over-the-counter and if needed add on Flonase.  For history of mild intermittent asthma with wheezing, I did send albuterol inhaler to your pharmacy.  If you have to begin to use this repetitively throughout the week then let us know as in that that you might need add on steroid inhaler.  For history of migraines, you report getting by with just conservative measure such as sleeping the headache off.  If your headaches become more frequent or intense then please let me know as in that event occurred are for you on medications specifically for migraine.  Would recommend to eventually do stop vaping.  Follow-up 1 year or sooner.  You may also want to investigate with the employer if they require you to get annual wellness exam.  If so he can schedule for CPE.  Esperanza Richters, PA-C

## 2018-11-25 NOTE — Patient Instructions (Signed)
For your history of allergic rhinitis, will recommend that you continue with Zyrtec over-the-counter and if needed add on Flonase.  For history of mild intermittent asthma with wheezing, I did send albuterol inhaler to your pharmacy.  If you have to begin to use this repetitively throughout the week then let us know as in that that you might need add on steroid inhaler.  For history of migraines, you report getting by with just conservative measure such as sleeping the headache off.  If your headaches become more frequent or intense then please let me know as in that event occurred are for you on medications specifically for migraine.  Would recommend to eventually do stop vaping.  Follow-up 1 year or sooner.  You may also want to investigate with the employer if they require you to get annual wellness exam.  If so he can schedule for CPE.

## 2023-11-14 ENCOUNTER — Other Ambulatory Visit: Payer: Self-pay

## 2023-11-14 ENCOUNTER — Ambulatory Visit
Admission: EM | Admit: 2023-11-14 | Discharge: 2023-11-14 | Disposition: A | Discharge status: Home / Self Care | Payer: 59 | Attending: Family Medicine | Admitting: Family Medicine

## 2023-11-14 DIAGNOSIS — J029 Acute pharyngitis, unspecified: Secondary | ICD-10-CM | POA: Diagnosis not present

## 2023-11-14 DIAGNOSIS — R6889 Other general symptoms and signs: Secondary | ICD-10-CM

## 2023-11-14 DIAGNOSIS — R509 Fever, unspecified: Secondary | ICD-10-CM | POA: Diagnosis not present

## 2023-11-14 MED ORDER — PREDNISONE 20 MG PO TABS: ORAL_TABLET | ORAL | 0 refills | Status: DC

## 2023-11-14 MED ORDER — OSELTAMIVIR PHOSPHATE 75 MG PO CAPS: 75.0000 mg | ORAL_CAPSULE | Freq: Two times a day (BID) | ORAL | 0 refills | Status: DC

## 2023-11-14 NOTE — ED Triage Notes (Signed)
 Pt presents to uc with co of headache sine noon with peripheral blurred vision, difficulty swallowing, sob, and chest tightness following shortly after. Pt has not taken any otc.

## 2023-11-14 NOTE — ED Provider Notes (Signed)
 [facility]    CSN: [id number] Arrival date & time: 11/14/23  1514      History   Chief Complaint Chief Complaint  Patient presents with   Headache   Shortness of Breath    HPI Chad Strickland is a 33 y.o. male.   HPI 33 year old male presents with headache, blurry vision and difficulty swallowing shortness of breath that occurred around noon today.  Past Medical History:  Diagnosis Date   Abrasion of hand, right    Allergy    Asthma, exercise induced    Metacarpal bone fracture    right small finger    There are no active problems to display for this patient.   Past Surgical History:  Procedure Laterality Date   FINGER CLOSED REDUCTION Right 01/27/2014   Procedure: CLOSED REDUCTION  of small finger metacarpal fracture PINNING with x 3 k-wires;  Surgeon: Prentice LELON Pagan, MD;  Location: [facility];  Service: Orthopedics;  Laterality: Right;       Home Medications    Prior to Admission medications   Medication Sig Start Date End Date Taking? Authorizing Provider  oseltamivir  (TAMIFLU ) 75 MG capsule Take 1 capsule (75 mg total) by mouth every 12 (twelve) hours. 11/14/23  Yes Teddy Sharper, FNP  predniSONE  (DELTASONE ) 20 MG tablet Take 3 tabs PO daily x 5 days. 11/14/23  Yes Teddy Sharper, FNP  albuterol  (PROVENTIL  HFA;VENTOLIN  HFA) 108 (90 Base) MCG/ACT inhaler Inhale 2 puffs into the lungs every 6 (six) hours as needed for wheezing or shortness of breath. 11/25/18   Saguier, [place], PA-C  cetirizine (ZYRTEC) 10 MG tablet Take 10 mg by mouth daily.    [provider]    Family History Family History  Problem Relation Age of Onset   Healthy Mother    Healthy Father     Social History Social History   Tobacco Use   Smoking status: Former    Current packs/day: 0.00    Average packs/day: 0.3 packs/day for 10.0 years (2.5 ttl pk-yrs)    Types: Cigarettes    Start date: 11/25/2006    Quit date: 11/25/2016    Years since  quitting: 6.9   Smokeless tobacco: Never  Vaping Use   Vaping status: Every Day  Substance Use Topics   Alcohol use: Yes    Comment: occasional. 1-2 beers a week.   Drug use: No     Allergies   Patient has no known allergies.   Review of Systems Review of Systems  Constitutional:  Positive for chills and fever.  Musculoskeletal:  Positive for arthralgias and myalgias.  Neurological:  Positive for headaches.  All other systems reviewed and are negative.    Physical Exam Triage Vital Signs ED Triage Vitals  Encounter Vitals Group     BP 11/14/23 1639 (!) 145/84     Systolic BP Percentile --      Diastolic BP Percentile --      Pulse Rate 11/14/23 1639 88     Resp 11/14/23 1639 16     Temp 11/14/23 1639 98.6 F (37 C)     Temp src --      SpO2 11/14/23 1639 98 %     Weight --      Height --      Head Circumference --      Peak Flow --      Pain Score 11/14/23 1638 0     Pain Loc --  Pain Education --      Exclude from Growth Chart --    No data found.  Updated Vital Signs BP (!) 145/84   Pulse 88   Temp 98.6 F (37 C)   Resp 16   SpO2 98%   Physical Exam Vitals and nursing note reviewed.  Constitutional:      General: He is not in acute distress.    Appearance: Normal appearance. He is well-developed. He is ill-appearing.  HENT:     Head: Normocephalic and atraumatic.     Right Ear: Tympanic membrane, ear canal and external ear normal.     Left Ear: Tympanic membrane, ear canal and external ear normal.     Mouth/Throat:     Mouth: Mucous membranes are moist.     Pharynx: Oropharynx is clear.  Eyes:     Extraocular Movements: Extraocular movements intact.     Conjunctiva/sclera: Conjunctivae normal.     Pupils: Pupils are equal, round, and reactive to light.  Cardiovascular:     Rate and Rhythm: Normal rate and regular rhythm.     Heart sounds: Normal heart sounds. No murmur heard. Pulmonary:     Effort: Pulmonary effort is normal.      Breath sounds: Normal breath sounds. No wheezing, rhonchi or rales.  Musculoskeletal:        General: Normal range of motion.     Cervical back: Normal range of motion and neck supple.  Skin:    General: Skin is warm and dry.  Neurological:     General: No focal deficit present.     Mental Status: He is alert and oriented to person, place, and time. Mental status is at baseline.  Psychiatric:        Mood and Affect: Mood normal.        Behavior: Behavior normal.      UC Treatments / Results  Labs (all labs ordered are listed, but only abnormal results are displayed) Labs Reviewed - No data to display  EKG   Radiology No results found.  Procedures Procedures (including critical care time)  Medications Ordered in UC Medications - No data to display  Initial Impression / Assessment and Plan / UC Course  I have reviewed the triage vital signs and the nursing notes.  Pertinent labs & imaging results that were available during my care of the patient were reviewed by me and considered in my medical decision making (see chart for details).     MDM: 1.  Influenza-like symptoms-Rx'd Tamiflu  75 mg capsule: Take 1 capsule twice daily x 5 days; 2.  Sore throat-Rx prednisone  20 mg tablet: Take 3 tablets daily x 5 days; 3.  Fever, unspecified type-Advised patient may take OTC Tylenol  1 g every 6 hours for fever (oral temperature greater than 100.3). Advised patient to take medications as directed with food to completion.  Advised patient to take prednisone  with first dose of Tamiflu  for the next 5 days.  Advised patient may take OTC Tylenol  1 g every 6 hours for fever (oral temperature greater than 100.3).  Encouraged increase daily water intake to 64 ounces per day while taking these medications.  Advised if symptoms worsen and/or unresolved please follow-up PCP or here for further evaluation.  Work note provided to patient prior to discharge today.  Patient discharged home, hemodynamically  stable. Final Clinical Impressions(s) / UC Diagnoses   Final diagnoses:  Influenza-like symptoms  Sore throat  Fever, unspecified     Discharge Instructions  Advised patient to take medications as directed with food to completion.  Advised patient to take prednisone  with first dose of Tamiflu  for the next 5 days.  Advised patient may take OTC Tylenol  1 g every 6 hours for fever (oral temperature greater than 100.3).  Encouraged increase daily water intake to 64 ounces per day while taking these medications.  Advised if symptoms worsen and/or unresolved please follow-up PCP or here for further evaluation.     ED Prescriptions     Medication Sig Dispense Auth. Provider   oseltamivir  (TAMIFLU ) 75 MG capsule Take 1 capsule (75 mg total) by mouth every 12 (twelve) hours. 10 capsule Amaury Kuzel, FNP   predniSONE  (DELTASONE ) 20 MG tablet Take 3 tabs PO daily x 5 days. 15 tablet Collie Kittel, FNP      PDMP not reviewed this encounter.   Teddy Sharper, FNP 11/14/23 [phone]

## 2023-11-14 NOTE — Discharge Instructions (Addendum)
 Advised patient to take medications as directed with food to completion.  Advised patient to take prednisone  with first dose of Tamiflu  for the next 5 days.  Advised patient may take OTC Tylenol  1 g every 6 hours for fever (oral temperature greater than 100.3).  Encouraged increase daily water intake to 64 ounces per day while taking these medications.  Advised if symptoms worsen and/or unresolved please follow-up PCP or here for further evaluation.

## 2024-03-11 ENCOUNTER — Ambulatory Visit: Payer: Self-pay
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
## 2024-03-11 NOTE — Progress Notes (Unsigned)
 New patient visit   Patient: Chad Strickland   DOB: 1991/04/13   33 y.o. Male  MRN: [id number] Visit Date: 03/12/2024  Today's healthcare provider: Trenton Frock, PA-C   No chief complaint on file.  Subjective    Chad D Hopkins is a 33 y.o. male who presents today as a new patient to establish care.   ***  Past Medical History:  Diagnosis Date   Abrasion of hand, right    Allergy    Asthma, exercise induced    Metacarpal bone fracture    right small finger   Past Surgical History:  Procedure Laterality Date   FINGER CLOSED REDUCTION Right 01/27/2014   Procedure: CLOSED REDUCTION  of small finger metacarpal fracture PINNING with x 3 k-wires;  Surgeon: Shellie Dials, MD;  Location: [facility];  Service: Orthopedics;  Laterality: Right;   Family Status  Relation Name Status   Mother  (Not Specified)   Father  (Not Specified)  No partnership data on file   Family History  Problem Relation Age of Onset   Healthy Mother    Healthy Father    Social History   Socioeconomic History   Marital status: Married    Spouse name: Not on file   Number of children: Not on file   Years of education: Not on file   Highest education level: Not on file  Occupational History   Not on file  Tobacco Use   Smoking status: Former    Current packs/day: 0.00    Average packs/day: 0.3 packs/day for 10.0 years (2.5 ttl pk-yrs)    Types: Cigarettes    Start date: 11/25/2006    Quit date: 11/25/2016    Years since quitting: 7.2   Smokeless tobacco: Never  Vaping Use   Vaping status: Every Day  Substance and Sexual Activity   Alcohol use: Yes    Comment: occasional. 1-2 beers a week.   Drug use: No   Sexual activity: Yes  Other Topics Concern   Not on file  Social History Narrative   Not on file   Social Drivers of Health   Financial Resource Strain: Not on file  Food Insecurity: Not on file  Transportation Needs: Not on file  Physical Activity: Not on  file  Stress: Not on file  Social Connections: Not on file   Outpatient Medications Prior to Visit  Medication Sig   albuterol  (PROVENTIL  HFA;VENTOLIN  HFA) 108 (90 Base) MCG/ACT inhaler Inhale 2 puffs into the lungs every 6 (six) hours as needed for wheezing or shortness of breath.   cetirizine (ZYRTEC) 10 MG tablet Take 10 mg by mouth daily.   oseltamivir  (TAMIFLU ) 75 MG capsule Take 1 capsule (75 mg total) by mouth every 12 (twelve) hours.   predniSONE  (DELTASONE ) 20 MG tablet Take 3 tabs PO daily x 5 days.   No facility-administered medications prior to visit.   No Known Allergies   There is no immunization history on file for this patient.  Health Maintenance  Topic Date Due   HIV Screening  Never done   Hepatitis C Screening  Never done   DTaP/Tdap/Td (1 - Tdap) Never done   COVID-19 Vaccine (1 - 2024-25 season) Never done   INFLUENZA VACCINE  05/07/2024   HPV VACCINES  Aged Out   Meningococcal B Vaccine  Aged Out    Patient Care Team: Pcp, No as PCP - General  Review of Systems  {Insert previous labs (optional):23779} {See  past labs  Heme  Chem  Endocrine  Serology  Results Review (optional):1}   Objective    There were no vitals taken for this visit. {Insert last BP/Wt (optional):23777}{See vitals history (optional):1}   Physical Exam ***  Depression Screen     No data to display         No results found for any visits on 03/12/24.  Assessment & Plan     There are no diagnoses linked to this encounter.   No follow-ups on file.      Trenton Frock, PA-C  [facility] [phone] (phone) [phone] (fax)  [facility]

## 2024-03-12 ENCOUNTER — Ambulatory Visit (HOSPITAL_BASED_OUTPATIENT_CLINIC_OR_DEPARTMENT_OTHER)
Admission: RE | Admit: 2024-03-12 | Discharge: 2024-03-12 | Disposition: A | Discharge status: Home / Self Care | Source: Ambulatory Visit | Attending: Physician Assistant | Admitting: Physician Assistant

## 2024-03-12 ENCOUNTER — Ambulatory Visit: Admitting: Physician Assistant

## 2024-03-12 ENCOUNTER — Ambulatory Visit: Payer: Self-pay | Admitting: Physician Assistant

## 2024-03-12 ENCOUNTER — Encounter: Payer: Self-pay | Admitting: Physician Assistant

## 2024-03-12 VITALS — BP 116/79 | HR 72 | Ht 71.0 in | Wt 167.0 lb

## 2024-03-12 DIAGNOSIS — S3992XA Unspecified injury of lower back, initial encounter: Secondary | ICD-10-CM | POA: Insufficient documentation

## 2024-09-26 ENCOUNTER — Telehealth: Admitting: Physician Assistant

## 2024-09-26 DIAGNOSIS — B9689 Other specified bacterial agents as the cause of diseases classified elsewhere: Secondary | ICD-10-CM

## 2024-09-26 DIAGNOSIS — J019 Acute sinusitis, unspecified: Secondary | ICD-10-CM

## 2024-09-26 MED ORDER — AMOXICILLIN-POT CLAVULANATE 875-125 MG PO TABS: 1.0000 | ORAL_TABLET | Freq: Two times a day (BID) | ORAL | 0 refills | Status: AC

## 2024-09-26 NOTE — Progress Notes (Signed)
